# Patient Record
Sex: Male | Born: 1985 | Race: Black or African American | Hispanic: No | Marital: Single | Smoking: Current some day smoker
Health system: Southern US, Community
[De-identification: ages and names within clinical notes are randomized; demographics above are authoritative.]

## PROBLEM LIST (undated history)

## (undated) DIAGNOSIS — I10 Essential (primary) hypertension: Secondary | ICD-10-CM

## (undated) HISTORY — PX: TONSILLECTOMY: SUR1361

---

## 2007-09-20 ENCOUNTER — Emergency Department: Payer: Self-pay | Admitting: Emergency Medicine

## 2007-09-20 ENCOUNTER — Other Ambulatory Visit: Payer: Self-pay

## 2010-08-31 ENCOUNTER — Emergency Department (HOSPITAL_COMMUNITY)
Admission: EM | Admit: 2010-08-31 | Discharge: 2010-08-31 | Disposition: A | Discharge status: Home / Self Care | Payer: Self-pay | Attending: Emergency Medicine | Admitting: Emergency Medicine

## 2010-08-31 DIAGNOSIS — L02419 Cutaneous abscess of limb, unspecified: Secondary | ICD-10-CM | POA: Insufficient documentation

## 2010-08-31 DIAGNOSIS — A4902 Methicillin resistant Staphylococcus aureus infection, unspecified site: Secondary | ICD-10-CM | POA: Insufficient documentation

## 2011-11-22 ENCOUNTER — Encounter (HOSPITAL_COMMUNITY): Payer: Self-pay

## 2011-11-22 ENCOUNTER — Emergency Department (HOSPITAL_COMMUNITY)
Admission: EM | Admit: 2011-11-22 | Discharge: 2011-11-22 | Disposition: A | Discharge status: Home / Self Care | Payer: Worker's Compensation | Attending: Emergency Medicine | Admitting: Emergency Medicine

## 2011-11-22 ENCOUNTER — Emergency Department (HOSPITAL_COMMUNITY): Payer: Worker's Compensation

## 2011-11-22 DIAGNOSIS — S9030XA Contusion of unspecified foot, initial encounter: Secondary | ICD-10-CM

## 2011-11-22 DIAGNOSIS — I10 Essential (primary) hypertension: Secondary | ICD-10-CM | POA: Insufficient documentation

## 2011-11-22 DIAGNOSIS — S92919A Unspecified fracture of unspecified toe(s), initial encounter for closed fracture: Secondary | ICD-10-CM

## 2011-11-22 DIAGNOSIS — F17200 Nicotine dependence, unspecified, uncomplicated: Secondary | ICD-10-CM | POA: Insufficient documentation

## 2011-11-22 DIAGNOSIS — Y990 Civilian activity done for income or pay: Secondary | ICD-10-CM | POA: Insufficient documentation

## 2011-11-22 DIAGNOSIS — IMO0002 Reserved for concepts with insufficient information to code with codable children: Secondary | ICD-10-CM | POA: Insufficient documentation

## 2011-11-22 DIAGNOSIS — Y9289 Other specified places as the place of occurrence of the external cause: Secondary | ICD-10-CM | POA: Insufficient documentation

## 2011-11-22 HISTORY — DX: Essential (primary) hypertension: I10

## 2011-11-22 MED ORDER — HYDROCODONE-ACETAMINOPHEN 5-325 MG PO TABS
2.0000 | ORAL_TABLET | Freq: Once | ORAL | Status: AC
  Administered 2011-11-22: 2 via ORAL
  Filled 2011-11-22: qty 2

## 2011-11-22 MED ORDER — HYDROCODONE-ACETAMINOPHEN 5-500 MG PO TABS: 1.0000 | ORAL_TABLET | Freq: Four times a day (QID) | ORAL | Status: AC | PRN
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
## 2011-12-07 ENCOUNTER — Emergency Department (HOSPITAL_COMMUNITY): Payer: Self-pay

## 2011-12-07 ENCOUNTER — Encounter (HOSPITAL_COMMUNITY): Payer: Self-pay | Admitting: Emergency Medicine

## 2011-12-07 ENCOUNTER — Emergency Department (HOSPITAL_COMMUNITY)
Admission: EM | Admit: 2011-12-07 | Discharge: 2011-12-07 | Disposition: A | Discharge status: Home / Self Care | Payer: Self-pay | Attending: Emergency Medicine | Admitting: Emergency Medicine

## 2011-12-07 DIAGNOSIS — F17200 Nicotine dependence, unspecified, uncomplicated: Secondary | ICD-10-CM | POA: Insufficient documentation

## 2011-12-07 DIAGNOSIS — I10 Essential (primary) hypertension: Secondary | ICD-10-CM | POA: Insufficient documentation

## 2011-12-07 DIAGNOSIS — R079 Chest pain, unspecified: Secondary | ICD-10-CM | POA: Insufficient documentation

## 2011-12-07 LAB — BASIC METABOLIC PANEL
BUN: 12 mg/dL (ref 6–23)
Creatinine, Ser: 1.19 mg/dL (ref 0.50–1.35)
GFR calc Af Amer: >90 mL/min (ref >90)
GFR calc non Af Amer: 84 mL/min — ABNORMAL LOW (ref >90)
Glucose, Bld: 84 mg/dL (ref 70–99)
Potassium: 3.7 mEq/L (ref 3.5–5.1)

## 2011-12-07 LAB — CBC
HCT: 43.1 % (ref 39.0–52.0)
Hemoglobin: 15.1 g/dL (ref 13.0–17.0)
MCHC: 35 g/dL (ref 30.0–36.0)
MCV: 84 fL (ref 78.0–100.0)
RDW: 13.7 % (ref 11.5–15.5)

## 2011-12-07 NOTE — ED Notes (Signed)
Patient states "pain with deep inspiration only".  Has a non-productive cough for several days.

## 2011-12-07 NOTE — Progress Notes (Signed)
Pt is a 26 yo man who has had intermittent chest pain for 2 days.  Sometimes the pain is felt on the right and sometimes on the left.  He thought initially that it came from eating a "hot pocket."  He has taken no medicine for this.  His main risk factor is smoking cigarettes.  Exam shows heart and lungs normal, no calf tenderness, no Homans's sign.  EKG shows inverted T waves in inferior leads, otherwise his tests showed only bronchitic changes on the chest x-ray.  Advised it is safe to go home, Dx nonspecific chest pain, advised to stop smoking.

## 2011-12-07 NOTE — ED Provider Notes (Signed)
4:50 PM  Date: 12/07/2011  Rate: 70  Rhythm: normal sinus rhythm  QRS Axis: right  Intervals: normal  ST/T Wave abnormalities: Inverted T waves in inferior leads.  Conduction Disutrbances:none  Narrative Interpretation: Abnormal EKG  Old EKG Reviewed: none available    Carleene Cooper III, MD 12/07/11 [phone]

## 2011-12-07 NOTE — ED Notes (Signed)
MD at bedside. 

## 2011-12-07 NOTE — ED Notes (Signed)
Patient states "need his BP medication refilled".  Unknown name or dosage.

## 2011-12-07 NOTE — ED Notes (Signed)
Pt c/o generalized CP x 3 days that is worse with inspiration; pt sts non productive cough

## 2011-12-07 NOTE — ED Provider Notes (Signed)
History     CSN: [id number]  Arrival date & time 12/07/11  1245   First MD Initiated Contact with Patient 12/07/11 1605      Chief Complaint  Patient presents with  . Chest Pain    (Consider location/radiation/quality/duration/timing/severity/associated sxs/prior treatment) HPI Comments: Patient presenting with chest pain for the past three days.  Pain located across his anterior chest on both the left and the right side.  Pain does not radiate.  Pain has been intermittent over the past 3 days.  He states that the pain is worse when he is standing and when he is lying down.    Pain not associated with SOB, nausea, vomiting, numbness or diaphoresis.  He has had a nonproductive cough for several days.  He currently smokes one pack every 3 days and has smoked for seven years.  He denies prior history of DVT or PE.  Denies LE pain or swelling.  Denies prolonged travel or surgeries in the past 4 weeks.  Denies hemoptysis.  Denies prior history of malignancy.  Denies family history of premature cardiac disease.  He does have a history of HTN and has been off of his antihypertensive medication for several years.    Patient is a 26 y.o. male presenting with chest pain. The history is provided by the patient.  Chest Pain Primary symptoms include cough. Pertinent negatives for primary symptoms include no fever, no shortness of breath, no wheezing, no palpitations, no nausea, no vomiting and no dizziness.  Pertinent negatives for associated symptoms include no diaphoresis and no numbness.     Past Medical History  Diagnosis Date  . Hypertension     Past Surgical History  Procedure Date  . Tonsillectomy     History reviewed. No pertinent family history.  History  Substance Use Topics  . Smoking status: Current Everyday Smoker  . Smokeless tobacco: Not on file  . Alcohol Use: Yes     Occassional      Review of Systems  Constitutional: Negative for fever, chills and diaphoresis.    Respiratory: Positive for cough. Negative for shortness of breath and wheezing.   Cardiovascular: Positive for chest pain. Negative for palpitations and leg swelling.  Gastrointestinal: Negative for nausea and vomiting.  Musculoskeletal: Negative for back pain.  Neurological: Negative for dizziness, syncope, light-headedness and numbness.    Allergies  Review of patient's allergies indicates no known allergies.  Home Medications  No current outpatient prescriptions on file.  BP 144/101  Pulse 69  Temp 97.7 F (36.5 C) (Oral)  Resp 16  SpO2 100%  Physical Exam  Nursing note and vitals reviewed. Constitutional: He appears well-developed and well-nourished. No distress.  HENT:  Head: Normocephalic and atraumatic.  Mouth/Throat: Oropharynx is clear and moist.  Neck: Normal range of motion. Neck supple.  Cardiovascular: Normal rate, regular rhythm, normal heart sounds and intact distal pulses.   Pulmonary/Chest: Effort normal and breath sounds normal. No respiratory distress. He has no wheezes. He has no rales. He exhibits tenderness.  Abdominal: Soft. There is no tenderness.  Musculoskeletal: Normal range of motion. He exhibits no edema.  Neurological: He is alert.  Skin: Skin is warm and dry. No rash noted. He is not diaphoretic. No erythema.  Psychiatric: He has a normal mood and affect.    ED Course  Procedures (including critical care time)  Labs Reviewed  BASIC METABOLIC PANEL - Abnormal; Notable for the following:    GFR calc non Af Amer 84 (*)  All other components within normal limits  CBC  POCT I-STAT TROPONIN I   Dg Chest 2 View  12/07/2011  *RADIOLOGY REPORT*  Clinical Data: Worsening chest pain, shortness of breath, question hypertension  CHEST - 2 VIEW  Comparison: None  Findings: Upper normal heart size. Mediastinal contours and pulmonary vascularity normal. Minimal peribronchial thickening. No infiltrate, pleural effusion or pneumothorax. No acute  osseous findings.  IMPRESSION: Minimal bronchitic changes   Original Report Authenticated By: Lollie Marrow, M.D.      No diagnosis found.  5:03 PM Discussed with Dr. Ignacia Palma.  He will see and evaluate patient also.  MDM  Patient is to be discharged with recommendation to follow up with PCP in regards to today's hospital visit. Chest pain is not likely of cardiac or pulmonary etiology d/t presentation, PERC negative, VSS, no tracheal deviation, no new heart murmur, RRR, breath sounds equal bilaterally, EKG without acute abnormalities, negative troponin, and negative CXR.  Return precautions have been discussed with patient.  He appears reliable for follow up and is agreeable to discharge.   Case has been discussed with and seen by Dr. Ignacia Palma who agrees with the above plan to discharge.         Pascal Lux [place], PA-C 12/08/11 [phone]

## 2011-12-08 NOTE — ED Provider Notes (Signed)
Medical screening examination/treatment/procedure(s) were conducted as a shared visit with non-physician practitioner(s) and myself.  I personally evaluated the patient during the encounter Pt is a 26 yo man who has had intermittent chest pain for 2 days. Sometimes the pain is felt on the right and sometimes on the left. He thought initially that it came from eating a "hot pocket." He has taken no medicine for this. His main risk factor is smoking cigarettes. Exam shows heart and lungs normal, no calf tenderness, no Homans's sign. EKG shows inverted T waves in inferior leads, otherwise his tests showed only bronchitic changes on the chest x-ray. Advised it is safe to go home, Dx nonspecific chest pain, advised to stop smoking.        Carleene Cooper III, MD 12/08/11 [phone]

## 2013-08-04 ENCOUNTER — Emergency Department: Payer: Self-pay | Admitting: Emergency Medicine

## 2013-08-13 ENCOUNTER — Ambulatory Visit: Payer: Self-pay | Admitting: Family Medicine
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
## 2015-06-17 ENCOUNTER — Emergency Department
Admission: EM | Admit: 2015-06-17 | Discharge: 2015-06-17 | Disposition: A | Discharge status: Home / Self Care | Payer: Self-pay | Attending: Emergency Medicine | Admitting: Emergency Medicine

## 2015-06-17 ENCOUNTER — Emergency Department: Payer: Self-pay

## 2015-06-17 DIAGNOSIS — I10 Essential (primary) hypertension: Secondary | ICD-10-CM | POA: Insufficient documentation

## 2015-06-17 DIAGNOSIS — F17200 Nicotine dependence, unspecified, uncomplicated: Secondary | ICD-10-CM | POA: Insufficient documentation

## 2015-06-17 DIAGNOSIS — F419 Anxiety disorder, unspecified: Secondary | ICD-10-CM | POA: Insufficient documentation

## 2015-06-17 DIAGNOSIS — R0789 Other chest pain: Secondary | ICD-10-CM | POA: Insufficient documentation

## 2015-06-17 LAB — CBC
HEMATOCRIT: 48.7 % (ref 40.0–52.0)
Hemoglobin: 16.4 g/dL (ref 13.0–18.0)
MCH: 28.9 pg (ref 26.0–34.0)
MCHC: 33.7 g/dL (ref 32.0–36.0)
MCV: 85.7 fL (ref 80.0–100.0)
Platelets: 288 10*3/uL (ref 150–440)
RBC: 5.68 MIL/uL (ref 4.40–5.90)
RDW: 14.3 % (ref 11.5–14.5)
WBC: 6.8 10*3/uL (ref 3.8–10.6)

## 2015-06-17 LAB — BASIC METABOLIC PANEL
Anion gap: 9 (ref 5–15)
BUN: 12 mg/dL (ref 6–20)
CALCIUM: 10 mg/dL (ref 8.9–10.3)
CO2: 30 mmol/L (ref 22–32)
CREATININE: 1.28 mg/dL — AB (ref 0.61–1.24)
Chloride: 106 mmol/L (ref 101–111)
GFR calc Af Amer: >60 mL/min (ref >60)
Glucose, Bld: 103 mg/dL — ABNORMAL HIGH (ref 65–99)
POTASSIUM: 4 mmol/L (ref 3.5–5.1)
SODIUM: 145 mmol/L (ref 135–145)

## 2015-06-17 LAB — TROPONIN I
Troponin I: <0.03 ng/mL (ref <0.031)
Troponin I: <0.03 ng/mL (ref <0.031)

## 2015-06-17 MED ORDER — KETOROLAC TROMETHAMINE 60 MG/2ML IM SOLN: 30.0000 mg | Freq: Once | INTRAMUSCULAR | Status: DC

## 2015-06-17 MED ORDER — AMLODIPINE BESYLATE 5 MG PO TABS: 5.0000 mg | ORAL_TABLET | Freq: Every day | ORAL | Status: AC

## 2015-06-17 MED ORDER — KETOROLAC TROMETHAMINE 30 MG/ML IJ SOLN
INTRAMUSCULAR | Status: AC
  Administered 2015-06-17: 30 mg
  Filled 2015-06-17: qty 1

## 2015-06-17 NOTE — ED Notes (Signed)
Pt c/o intermittant chest pain x 1 week,  He also reports that his arm and legs have tingling sensation/heavy feeling at times. He has a high stress job that is physical at times and he states that he feels increased stress and anxiety. Pt reports hx of HTN but states that he has not been taking his medication because he has not had a PMD for "a while."  Pt denies SOB, diaphoresis, nausea or vomiting.  Skin is warm and dry, pt converses with ease.

## 2015-06-17 NOTE — ED Provider Notes (Addendum)
[facility] Emergency Department Provider Note  ____________________________________________   I have reviewed the triage vital signs and the nursing notes.   HISTORY  Chief Complaint I have a stressful job   HPI Thomas Hickman is a 30 y.o. male  hypertension and noncompliance, as well as anxiety states that he works in a stressful job. 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 He carries heavy boxes in. 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 In any event, a week ago he carried a very heavy set of items in the next day he had a soreness in his chest wall. That persisted for the week off and on. It was nonexertional. It was positional. Sometimes awakened myopathy rolled over the wrong way. There is no migration or radiation. The pain seems to be across the center of his pectoralis muscle bilaterally. It is worse when he picked heavy objects up. It is not worse with food. It is not worse with exertion. He has no shortness of breath diaphoresis nausea or vomiting. He states that he is worried about this discomfort. It was almost better and then yesterday he unload the truck again and feels that he get aggravated those muscles. He denies any leg swelling, pleuritic chest pain, recent surgery, or personal or family history of PE or DVT. No recent travel. The only thing that he can think that makes the pain worsens picking up heavy objects using his chest. No family history of CAD he states.  Past Medical History  Diagnosis Date  . Hypertension     There are no active problems to display for this patient.   Past Surgical History  Procedure Laterality Date  . Tonsillectomy      No current outpatient prescriptions on file.  Allergies Review of patient's allergies indicates no known allergies.  No family history on file.  Social History Social History  Substance Use Topics  . Smoking status: Current Every Day Smoker  . Smokeless tobacco: None  .  Alcohol Use: Yes     Comment: Occassional    Review of Systems Constitutional: No fever/chills Eyes: No visual changes. ENT: No sore throat. No stiff neck no neck pain Cardiovascular: He history of present illness chest pain. Respiratory: Denies shortness of breath. Gastrointestinal:   no vomiting.  No diarrhea.  No constipation. Genitourinary: Negative for dysuria. Musculoskeletal: Negative lower extremity swelling Skin: Negative for rash. Neurological: Negative for headaches, focal weakness or numbness. 10-point ROS otherwise negative.  ____________________________________________   PHYSICAL EXAM:  VITAL SIGNS: ED Triage Vitals  Enc Vitals Group     BP 06/17/15 1053 165/98 mmHg     Pulse Rate 06/17/15 1053 79     Resp 06/17/15 1053 16     Temp 06/17/15 1053 98.1 F (36.7 C)     Temp Source 06/17/15 1053 Oral     SpO2 06/17/15 1053 100 %     Weight 06/17/15 1048 160 lb (72.576 kg)     Height 06/17/15 1048  (1.727 m)     Head Cir --      Peak Flow --      Pain Score 06/17/15 1048 8     Pain Loc --      Pain Edu? --      Excl. in GC? --     Constitutional: Alert and oriented. Well appearing and in no acute distress. Watching television Eyes: Conjunctivae are normal. PERRL. EOMI. Head: Atraumatic. Nose: No  congestion/rhinnorhea. Mouth/Throat: Mucous membranes are moist.  Oropharynx non-erythematous. Neck: No stridor.   Nontender with no meningismus Cardiovascular: Normal rate, regular rhythm. Grossly normal heart sounds.  Good peripheral circulation. Chest: Tender to palpation of the anterior pectoralis muscles bilaterally. By palpating these areas patient states "ouch that's the pain right there" and pulls back. There is no flail chest there is no crepitus there is no rib fracture palpated. I can also elicit the pain by having the patient press against resistance with his arms. Respiratory: Normal respiratory effort.  No retractions. Lungs CTAB. Abdominal: Soft  and nontender. No distention. No guarding no rebound Back:  There is no focal tenderness or step off there is no midline tenderness there are no lesions noted. there is no CVA tenderness Musculoskeletal: No lower extremity tenderness. No joint effusions, no DVT signs strong distal pulses no edema Neurologic:  Normal speech and language. No gross focal neurologic deficits are appreciated.  Skin:  Skin is warm, dry and intact. No rash noted. Psychiatric: Mood and affect are anxious. Speech and behavior are normal.  ____________________________________________   LABS (all labs ordered are listed, but only abnormal results are displayed)  Labs Reviewed  BASIC METABOLIC PANEL - Abnormal; Notable for the following:    Glucose, Bld 103 (*)    Creatinine, Ser 1.28 (*)    All other components within normal limits  CBC  TROPONIN I  TROPONIN I   ____________________________________________  EKG  I personally interpreted any EKGs ordered by me or triage Normal sinus rhythm rate 80 bpm no acute ST elevation or acute ST depression, there are flipped T waves in 3 and aVF that were seen in 2013. ____________________________________________  RADIOLOGY  I reviewed any imaging ordered by me or triage that were performed during my shift and, if possible, patient and/or family made aware of any abnormal findings. ____________________________________________   PROCEDURES  Procedure(s) performed: None  Critical Care performed: None  ____________________________________________   INITIAL IMPRESSION / ASSESSMENT AND PLAN / ED COURSE  Pertinent labs & imaging results that were available during my care of the patient were reviewed by me and considered in my medical decision making (see chart for details).  Patient with reproducible chest wall pain after lifting heavy objects. Although he does have a history of hypertension and anxiety, I do not believe this likely represents ACS. He is perk  negative I do not believe he has a PE. He has no risk factors for early CAD, dissection myocarditis endocarditis or pericarditis. We will send a second set of cardiac markers as a precaution, give him anti-inflammatory pain medication and reassess.At this time, there does not appear to be clinical evidence to support the diagnosis of pulmonary embolus, dissection, myocarditis, endocarditis, pericarditis, pericardial tamponade, acute coronary syndrome, pneumothorax, pneumonia, or any other acute intrathoracic pathology that will require admission or acute intervention. Nor is there evidence of any significant intra-abdominal pathology causing this discomfort. His blood pressure is slightly up he is very anxious by his own admission and description. He is supposed to be taking blood pressure medication we will offer that to him  ----------------------------------------- 3:48 PM on 06/17/2015 -----------------------------------------  Pain free after toradol eager to go home. Return precatuiosn and follow up given and understood.____________________________________________   FINAL CLINICAL IMPRESSION(S) / ED DIAGNOSES  Final diagnoses:  None      This chart was dictated using voice recognition software.  Despite best efforts to proofread,  errors can occur which can change meaning.  Jeanmarie PlantJames A Naiyah Klostermann, MD 06/17/15 1453  Jeanmarie PlantJames A Rosaire Cueto, MD 06/17/15 [phone]

## 2015-06-17 NOTE — Discharge Instructions (Signed)
Chest Wall Pain °Chest wall pain is pain in or around the bones and muscles of your chest. Sometimes, an injury causes this pain. Sometimes, the cause may not be known. This pain may take several weeks or longer to get better. °HOME CARE INSTRUCTIONS  °Pay attention to any changes in your symptoms. Take these actions to help with your pain:  °· Rest as told by your health care provider.   °· Avoid activities that cause pain. These include any activities that use your chest muscles or your abdominal and side muscles to lift heavy items.    °· If directed, apply ice to the painful area: °¨ Put ice in a plastic bag. °¨ Place a towel between your skin and the bag. °¨ Leave the ice on for 20 minutes, 2-3 times per day. °· Take over-the-counter and prescription medicines only as told by your health care provider. °· Do not use tobacco products, including cigarettes, chewing tobacco, and e-cigarettes. If you need help quitting, ask your health care provider. °· Keep all follow-up visits as told by your health care provider. This is important. °SEEK MEDICAL CARE IF: °· You have a fever. °· Your chest pain becomes worse. °· You have new symptoms. °SEEK IMMEDIATE MEDICAL CARE IF: °· You have nausea or vomiting. °· You feel sweaty or light-headed. °· You have a cough with phlegm (sputum) or you cough up blood. °· You develop shortness of breath. °  °This information is not intended to replace advice given to you by your health care provider. Make sure you discuss any questions you have with your health care provider. °  °Document Released: 03/29/2005 Document Revised: 12/18/2014 Document Reviewed: 06/24/2014 °Elsevier Interactive Patient Education ©2016 [employer] ° °Chest Pain Observation °It is often hard to give a specific diagnosis for the cause of chest pain. Among other possibilities your symptoms might be caused by inadequate oxygen delivery to your heart (angina). Angina that is not treated or evaluated can lead to  a heart attack (myocardial infarction) or death. °Blood tests, electrocardiograms, and X-rays may have been done to help determine a possible cause of your chest pain. After evaluation and observation, your health care provider has determined that it is unlikely your pain was caused by an unstable condition that requires hospitalization. However, a full evaluation of your pain may need to be completed, with additional diagnostic testing as directed. It is very important to keep your follow-up appointments. Not keeping your follow-up appointments could result in permanent heart damage, disability, or death. If there is any problem keeping your follow-up appointments, you must call your health care provider. °HOME CARE INSTRUCTIONS  °Due to the slight chance that your pain could be angina, it is important to follow your health care provider's treatment plan and also maintain a healthy lifestyle: °· Maintain or work toward achieving a healthy weight. °· Stay physically active and exercise regularly. °· Decrease your salt intake. °· Eat a balanced, healthy diet. Talk to a dietitian to learn about heart-healthy foods. °· Increase your fiber intake by including whole grains, vegetables, fruits, and nuts in your diet. °· Avoid situations that cause stress, anger, or depression. °· Take medicines as advised by your health care provider. Report any side effects to your health care provider. Do not stop medicines or adjust the dosages on your own. °· Quit smoking. Do not use nicotine patches or gum until you check with your health care provider. °· Keep your blood pressure, blood sugar, and cholesterol levels within normal   limits. °· Limit alcohol intake to no more than 1 drink per day for women who are not pregnant and 2 drinks per day for men. °· Do not abuse drugs. °SEEK IMMEDIATE MEDICAL CARE IF: °You have severe chest pain or pressure which may include symptoms such as: °· You feel pain or pressure in your arms, neck,  jaw, or back. °· You have severe back or abdominal pain, feel sick to your stomach (nauseous), or throw up (vomit). °· You are sweating profusely. °· You are having a fast or irregular heartbeat. °· You feel short of breath while at rest. °· You notice increasing shortness of breath during rest, sleep, or with activity. °· You have chest pain that does not get better after rest or after taking your usual medicine. °· You wake from sleep with chest pain. °· You are unable to sleep because you cannot breathe. °· You develop a frequent cough or you are coughing up blood. °· You feel dizzy, faint, or experience extreme fatigue. °· You develop severe weakness, dizziness, fainting, or chills. °Any of these symptoms may represent a serious problem that is an emergency. Do not wait to see if the symptoms will go away. Call your local emergency services (911 in the [place] Do not drive yourself to the hospital. °MAKE SURE YOU: °· Understand these instructions. °· Will watch your condition. °· Will get help right away if you are not doing well or get worse. °  °This information is not intended to replace advice given to you by your health care provider. Make sure you discuss any questions you have with your health care provider. °  °Document Released: 05/01/2010 Document Revised: 04/03/2013 Document Reviewed: 09/28/2012 °Elsevier Interactive Patient Education ©2016 [employer] ° °

## 2015-06-17 NOTE — ED Notes (Signed)
Pt c/o chest pain that radiates left to right vis verse , intermittent for the past couple of days with SOB.. Denies any recent cough/congestion.. Pt is in NAD, respirations WNL, skin is warm and dry.

## 2015-07-19 ENCOUNTER — Emergency Department
Admission: EM | Admit: 2015-07-19 | Discharge: 2015-07-19 | Disposition: A | Discharge status: Home / Self Care | Payer: Self-pay | Attending: Emergency Medicine | Admitting: Emergency Medicine

## 2015-07-19 ENCOUNTER — Encounter: Payer: Self-pay | Admitting: Emergency Medicine

## 2015-07-19 ENCOUNTER — Emergency Department: Payer: Self-pay

## 2015-07-19 DIAGNOSIS — I10 Essential (primary) hypertension: Secondary | ICD-10-CM | POA: Insufficient documentation

## 2015-07-19 DIAGNOSIS — R079 Chest pain, unspecified: Secondary | ICD-10-CM | POA: Insufficient documentation

## 2015-07-19 DIAGNOSIS — F410 Panic disorder [episodic paroxysmal anxiety] without agoraphobia: Secondary | ICD-10-CM | POA: Insufficient documentation

## 2015-07-19 DIAGNOSIS — F17200 Nicotine dependence, unspecified, uncomplicated: Secondary | ICD-10-CM | POA: Insufficient documentation

## 2015-07-19 LAB — CBC
HCT: 48.6 % (ref 40.0–52.0)
Hemoglobin: 16.4 g/dL (ref 13.0–18.0)
MCH: 28.8 pg (ref 26.0–34.0)
MCHC: 33.7 g/dL (ref 32.0–36.0)
MCV: 85.5 fL (ref 80.0–100.0)
PLATELETS: 310 10*3/uL (ref 150–440)
RBC: 5.68 MIL/uL (ref 4.40–5.90)
RDW: 14.4 % (ref 11.5–14.5)
WBC: 7.7 10*3/uL (ref 3.8–10.6)

## 2015-07-19 LAB — BASIC METABOLIC PANEL
Anion gap: 7 (ref 5–15)
BUN: 15 mg/dL (ref 6–20)
CALCIUM: 9.7 mg/dL (ref 8.9–10.3)
CO2: 27 mmol/L (ref 22–32)
CREATININE: 1.26 mg/dL — AB (ref 0.61–1.24)
Chloride: 104 mmol/L (ref 101–111)
GFR calc Af Amer: >60 mL/min (ref >60)
GLUCOSE: 94 mg/dL (ref 65–99)
POTASSIUM: 3.2 mmol/L — AB (ref 3.5–5.1)
SODIUM: 138 mmol/L (ref 135–145)

## 2015-07-19 LAB — TROPONIN I

## 2015-07-19 MED ORDER — IBUPROFEN 800 MG PO TABS
800.0000 mg | ORAL_TABLET | Freq: Once | ORAL | Status: AC
  Administered 2015-07-19: 800 mg via ORAL
  Filled 2015-07-19: qty 1

## 2015-07-19 MED ORDER — DIAZEPAM 5 MG PO TABS
10.0000 mg | ORAL_TABLET | Freq: Once | ORAL | Status: AC
  Administered 2015-07-19: 10 mg via ORAL
  Filled 2015-07-19: qty 2

## 2015-07-19 MED ORDER — IBUPROFEN 800 MG PO TABS: 800.0000 mg | ORAL_TABLET | Freq: Three times a day (TID) | ORAL | Status: AC | PRN

## 2015-07-19 MED ORDER — DIAZEPAM 5 MG PO TABS: 5.0000 mg | ORAL_TABLET | Freq: Three times a day (TID) | ORAL | Status: AC | PRN

## 2015-07-19 NOTE — ED Provider Notes (Signed)
[facility] Emergency Department Provider Note     Time seen: ----------------------------------------- 8:23 PM on 07/19/2015 -----------------------------------------    I have reviewed the triage vital signs and the nursing notes.   HISTORY  Chief Complaint Chest Pain    HPI Thomas Hickman is a 30 y.o. male who presents to ER forfor chest pain that is on his right side currently. Patient states sometimes some both sides, sometimes goes into his abdomen. Patient started having this about a month ago and has been coming and going. Patient is not sure if he is anxious, states he vomited once today and he has felt dizzy and short of breath at times. Currently the pain is moderate, nothing makes it better.   Past Medical History  Diagnosis Date  . Hypertension     There are no active problems to display for this patient.   Past Surgical History  Procedure Laterality Date  . Tonsillectomy      Allergies Review of patient's allergies indicates no known allergies.  Social History Social History  Substance Use Topics  . Smoking status: Current Some Day Smoker  . Smokeless tobacco: None  . Alcohol Use: No    Review of Systems Constitutional: Negative for fever. Eyes: Negative for visual changes. ENT: Negative for sore throat. Cardiovascular: Positive for chest pain Respiratory: Positive shortness of breath Gastrointestinal: Negative for abdominal pain, vomiting and diarrhea. Genitourinary: Negative for dysuria. Musculoskeletal: Negative for back pain. Skin: Negative for rash. Neurological: Negative for headaches, focal weakness or numbness.  10-point ROS otherwise negative.  ____________________________________________   PHYSICAL EXAM:  VITAL SIGNS: ED Triage Vitals  Enc Vitals Group     BP 07/19/15 1853 150/117 mmHg     Pulse Rate 07/19/15 1853 102     Resp 07/19/15 1853 18     Temp 07/19/15 1853 98.3 F (36.8 C)     Temp  Source 07/19/15 1853 Oral     SpO2 07/19/15 1853 99 %     Weight 07/19/15 1853 150 lb (68.04 kg)     Height 07/19/15 1853  (1.727 m)     Head Cir --      Peak Flow --      Pain Score 07/19/15 1854 9     Pain Loc --      Pain Edu? --      Excl. in GC? --    Constitutional: Alert and oriented. Anxious, no acute distress Eyes: Conjunctivae are normal. PERRL. Normal extraocular movements. ENT   Head: Normocephalic and atraumatic.   Nose: No congestion/rhinnorhea.   Mouth/Throat: Mucous membranes are moist.   Neck: No stridor. Cardiovascular: Normal rate, regular rhythm. Normal and symmetric distal pulses are present in all extremities. No murmurs, rubs, or gallops. Respiratory: Normal respiratory effort without tachypnea nor retractions. Breath sounds are clear and equal bilaterally. No wheezes/rales/rhonchi. Gastrointestinal: Soft and nontender. No distention. No abdominal bruits.  Musculoskeletal: Nontender with normal range of motion in all extremities. No joint effusions.  No lower extremity tenderness nor edema. Right-sided chest wall tenderness Neurologic:  Normal speech and language. No gross focal neurologic deficits are appreciated. Speech is normal. No gait instability. Skin:  Skin is warm, dry and intact. No rash noted. Psychiatric: He pressed mood and affect ____________________________________________  EKG: Interpreted by me. Sinus rhythm with a rate of 93 bpm, normal PR interval, normal QRS, normal QT interval. Normal axis.  ____________________________________________  ED COURSE:  Pertinent labs & imaging results that were available during  my care of the patient were reviewed by me and considered in my medical decision making (see chart for details). Patient is in no acute distress, will check basic labs, give oral Valium and Motrin. ____________________________________________    LABS (pertinent positives/negatives)  Labs Reviewed  BASIC METABOLIC  PANEL - Abnormal; Notable for the following:    Potassium 3.2 (*)    Creatinine, Ser 1.26 (*)    All other components within normal limits  CBC  TROPONIN I    RADIOLOGY  Chest x-ray  IMPRESSION: No acute pulmonary process. ____________________________________________  FINAL ASSESSMENT AND PLAN  Chest pain, panic attack  Plan: Patient with labs and imaging as dictated above. Patient is in no acute distress, this appears to be anxiety related. He is feeling better after Motrin and Valium. He'll be discharged on same. Have advised close follow-up with etiology.   Emily FilbertWilliams, Everton Bertha E, MD   Emily FilbertJonathan E Francies Inch, MD 07/19/15 2033

## 2015-07-19 NOTE — ED Notes (Signed)
Non-pharmaceutical methods of anxiety management discussed with patient including mindfulness and deep breathing.  The option of counseling also discussed.

## 2015-07-19 NOTE — ED Notes (Signed)
Pt reports he has had high BP, was on amlodipine but it wasn't working.  Pt reports currently seeing black spots and knows his BP is up.  Current bp 160/102

## 2015-07-19 NOTE — ED Notes (Signed)
Pt calling family to pick him up from ED

## 2015-07-19 NOTE — ED Notes (Signed)
Pt complains of pain in chest that is on the left and right side. Pt states pain is constant and is a shooting pain. Pt states it started 3 days ago and feels heaviness in both arms. Pt states he vomited x1 today and has felt dizzy and SOB at times. Pt was seen here one month ago for same complaint does not remember what he was told and has not followed up since that time with a PCP.

## 2015-07-19 NOTE — Discharge Instructions (Signed)
Nonspecific Chest Pain  °Chest pain can be caused by many different conditions. There is always a chance that your pain could be related to something serious, such as a heart attack or a blood clot in your lungs. Chest pain can also be caused by conditions that are not life-threatening. If you have chest pain, it is very important to follow up with your health care provider. °CAUSES  °Chest pain can be caused by: °· Heartburn. °· Pneumonia or bronchitis. °· Anxiety or stress. °· Inflammation around your heart (pericarditis) or lung (pleuritis or pleurisy). °· A blood clot in your lung. °· A collapsed lung (pneumothorax). It can develop suddenly on its own (spontaneous pneumothorax) or from trauma to the chest. °· Shingles infection (varicella-zoster virus). °· Heart attack. °· Damage to the bones, muscles, and cartilage that make up your chest wall. This can include: °· Bruised bones due to injury. °· Strained muscles or cartilage due to frequent or repeated coughing or overwork. °· Fracture to one or more ribs. °· Sore cartilage due to inflammation (costochondritis). °RISK FACTORS  °Risk factors for chest pain may include: °· Activities that increase your risk for trauma or injury to your chest. °· Respiratory infections or conditions that cause frequent coughing. °· Medical conditions or overeating that can cause heartburn. °· Heart disease or family history of heart disease. °· Conditions or health behaviors that increase your risk of developing a blood clot. °· Having had chicken pox (varicella zoster). °SIGNS AND SYMPTOMS °Chest pain can feel like: °· Burning or tingling on the surface of your chest or deep in your chest. °· Crushing, pressure, aching, or squeezing pain. °· Dull or sharp pain that is worse when you move, cough, or take a deep breath. °· Pain that is also felt in your back, neck, shoulder, or arm, or pain that spreads to any of these areas. °Your chest pain may come and go, or it may stay  constant. °DIAGNOSIS °Lab tests or other studies may be needed to find the cause of your pain. Your health care provider may have you take a test called an ambulatory ECG (electrocardiogram). An ECG records your heartbeat patterns at the time the test is performed. You may also have other tests, such as: °· Transthoracic echocardiogram (TTE). During echocardiography, sound waves are used to create a picture of all of the heart structures and to look at how blood flows through your heart. °· Transesophageal echocardiogram (TEE). This is a more advanced imaging test that obtains images from inside your body. It allows your health care provider to see your heart in finer detail. °· Cardiac monitoring. This allows your health care provider to monitor your heart rate and rhythm in real time. °· Holter monitor. This is a portable device that records your heartbeat and can help to diagnose abnormal heartbeats. It allows your health care provider to track your heart activity for several days, if needed. °· Stress tests. These can be done through exercise or by taking medicine that makes your heart beat more quickly. °· Blood tests. °· Imaging tests. °TREATMENT  °Your treatment depends on what is causing your chest pain. Treatment may include: °· Medicines. These may include: °· Acid blockers for heartburn. °· Anti-inflammatory medicine. °· Pain medicine for inflammatory conditions. °· Antibiotic medicine, if an infection is present. °· Medicines to dissolve blood clots. °· Medicines to treat coronary artery disease. °· Supportive care for conditions that do not require medicines. This may include: °· Resting. °· Applying heat   or cold packs to injured areas. °· Limiting activities until pain decreases. °HOME CARE INSTRUCTIONS °· If you were prescribed an antibiotic medicine, finish it all even if you start to feel better. °· Avoid any activities that bring on chest pain. °· Do not use any tobacco products, including  cigarettes, chewing tobacco, or electronic cigarettes. If you need help quitting, ask your health care provider. °· Do not drink alcohol. °· Take medicines only as directed by your health care provider. °· Keep all follow-up visits as directed by your health care provider. This is important. This includes any further testing if your chest pain does not go away. °· If heartburn is the cause for your chest pain, you may be told to keep your head raised (elevated) while sleeping. This reduces the chance that acid will go from your stomach into your esophagus. °· Make lifestyle changes as directed by your health care provider. These may include: °· Getting regular exercise. Ask your health care provider to suggest some activities that are safe for you. °· Eating a heart-healthy diet. A registered dietitian can help you to learn healthy eating options. °· Maintaining a healthy weight. °· Managing diabetes, if necessary. °· Reducing stress. °SEEK MEDICAL CARE IF: °· Your chest pain does not go away after treatment. °· You have a rash with blisters on your chest. °· You have a fever. °SEEK IMMEDIATE MEDICAL CARE IF:  °· Your chest pain is worse. °· You have an increasing cough, or you cough up blood. °· You have severe abdominal pain. °· You have severe weakness. °· You faint. °· You have chills. °· You have sudden, unexplained chest discomfort. °· You have sudden, unexplained discomfort in your arms, back, neck, or jaw. °· You have shortness of breath at any time. °· You suddenly start to sweat, or your skin gets clammy. °· You feel nauseous or you vomit. °· You suddenly feel light-headed or dizzy. °· Your heart begins to beat quickly, or it feels like it is skipping beats. °These symptoms may represent a serious problem that is an emergency. Do not wait to see if the symptoms will go away. Get medical help right away. Call your local emergency services (911 in the [place] Do not drive yourself to the hospital. °  °This  information is not intended to replace advice given to you by your health care provider. Make sure you discuss any questions you have with your health care provider. °  °Document Released: 01/06/2005 Document Revised: 04/19/2014 Document Reviewed: 11/02/2013 °Elsevier Interactive Patient Education ©2016 [employer] ° °Panic Attacks °Panic attacks are sudden, short-lived surges of severe anxiety, fear, or discomfort. They may occur for no reason when you are relaxed, when you are anxious, or when you are sleeping. Panic attacks may occur for a number of reasons:  °· Healthy people occasionally have panic attacks in extreme, life-threatening situations, such as war or natural disasters. Normal anxiety is a protective mechanism of the body that helps us react to danger (fight or flight response). °· Panic attacks are often seen with anxiety disorders, such as panic disorder, social anxiety disorder, generalized anxiety disorder, and phobias. Anxiety disorders cause excessive or uncontrollable anxiety. They may interfere with your relationships or other life activities. °· Panic attacks are sometimes seen with other mental illnesses, such as depression and posttraumatic stress disorder. °· Certain medical conditions, prescription medicines, and drugs of abuse can cause panic attacks. °SYMPTOMS  °Panic attacks start suddenly, peak within 20 minutes, and are   accompanied by four or more of the following symptoms: °· Pounding heart or fast heart rate (palpitations). °· Sweating. °· Trembling or shaking. °· Shortness of breath or feeling smothered. °· Feeling choked. °· Chest pain or discomfort. °· Nausea or strange feeling in your stomach. °· Dizziness, light-headedness, or feeling like you will faint. °· Chills or hot flushes. °· Numbness or tingling in your lips or hands and feet. °· Feeling that things are not real or feeling that you are not yourself. °· Fear of losing control or going crazy. °· Fear of dying. °Some  of these symptoms can mimic serious medical conditions. For example, you may think you are having a heart attack. Although panic attacks can be very scary, they are not life threatening. °DIAGNOSIS  °Panic attacks are diagnosed through an assessment by your health care provider. Your health care provider will ask questions about your symptoms, such as where and when they occurred. Your health care provider will also ask about your medical history and use of alcohol and drugs, including prescription medicines. Your health care provider may order blood tests or other studies to rule out a serious medical condition. Your health care provider may refer you to a mental health professional for further evaluation. °TREATMENT  °· Most healthy people who have one or two panic attacks in an extreme, life-threatening situation will not require treatment. °· The treatment for panic attacks associated with anxiety disorders or other mental illness typically involves counseling with a mental health professional, medicine, or a combination of both. Your health care provider will help determine what treatment is best for you. °· Panic attacks due to physical illness usually go away with treatment of the illness. If prescription medicine is causing panic attacks, talk with your health care provider about stopping the medicine, decreasing the dose, or substituting another medicine. °· Panic attacks due to alcohol or drug abuse go away with abstinence. Some adults need professional help in order to stop drinking or using drugs. °HOME CARE INSTRUCTIONS  °· Take all medicines as directed by your health care provider.   °· Schedule and attend follow-up visits as directed by your health care provider. It is important to keep all your appointments. °SEEK MEDICAL CARE IF: °· You are not able to take your medicines as prescribed. °· Your symptoms do not improve or get worse. °SEEK IMMEDIATE MEDICAL CARE IF:  °· You experience panic attack  symptoms that are different than your usual symptoms. °· You have serious thoughts about hurting yourself or others. °· You are taking medicine for panic attacks and have a serious side effect. °MAKE SURE YOU: °· Understand these instructions. °· Will watch your condition. °· Will get help right away if you are not doing well or get worse. °  °This information is not intended to replace advice given to you by your health care provider. Make sure you discuss any questions you have with your health care provider. °  °Document Released: 03/29/2005 Document Revised: 04/03/2013 Document Reviewed: 11/10/2012 °Elsevier Interactive Patient Education ©2016 [employer] ° °

## 2016-06-27 IMAGING — CR DG CHEST 2V
1 series · 2 of 2 positions shown · non-contrast
Comparison: 06/17/2015

CLINICAL DATA: Chest pain and shortness of breath for 2 days.

EXAM:
CHEST  2 VIEW

[Series 1: dg chest 2 view · 0.14mm/px · 2 of 2 slices shown]
[im 1/2]
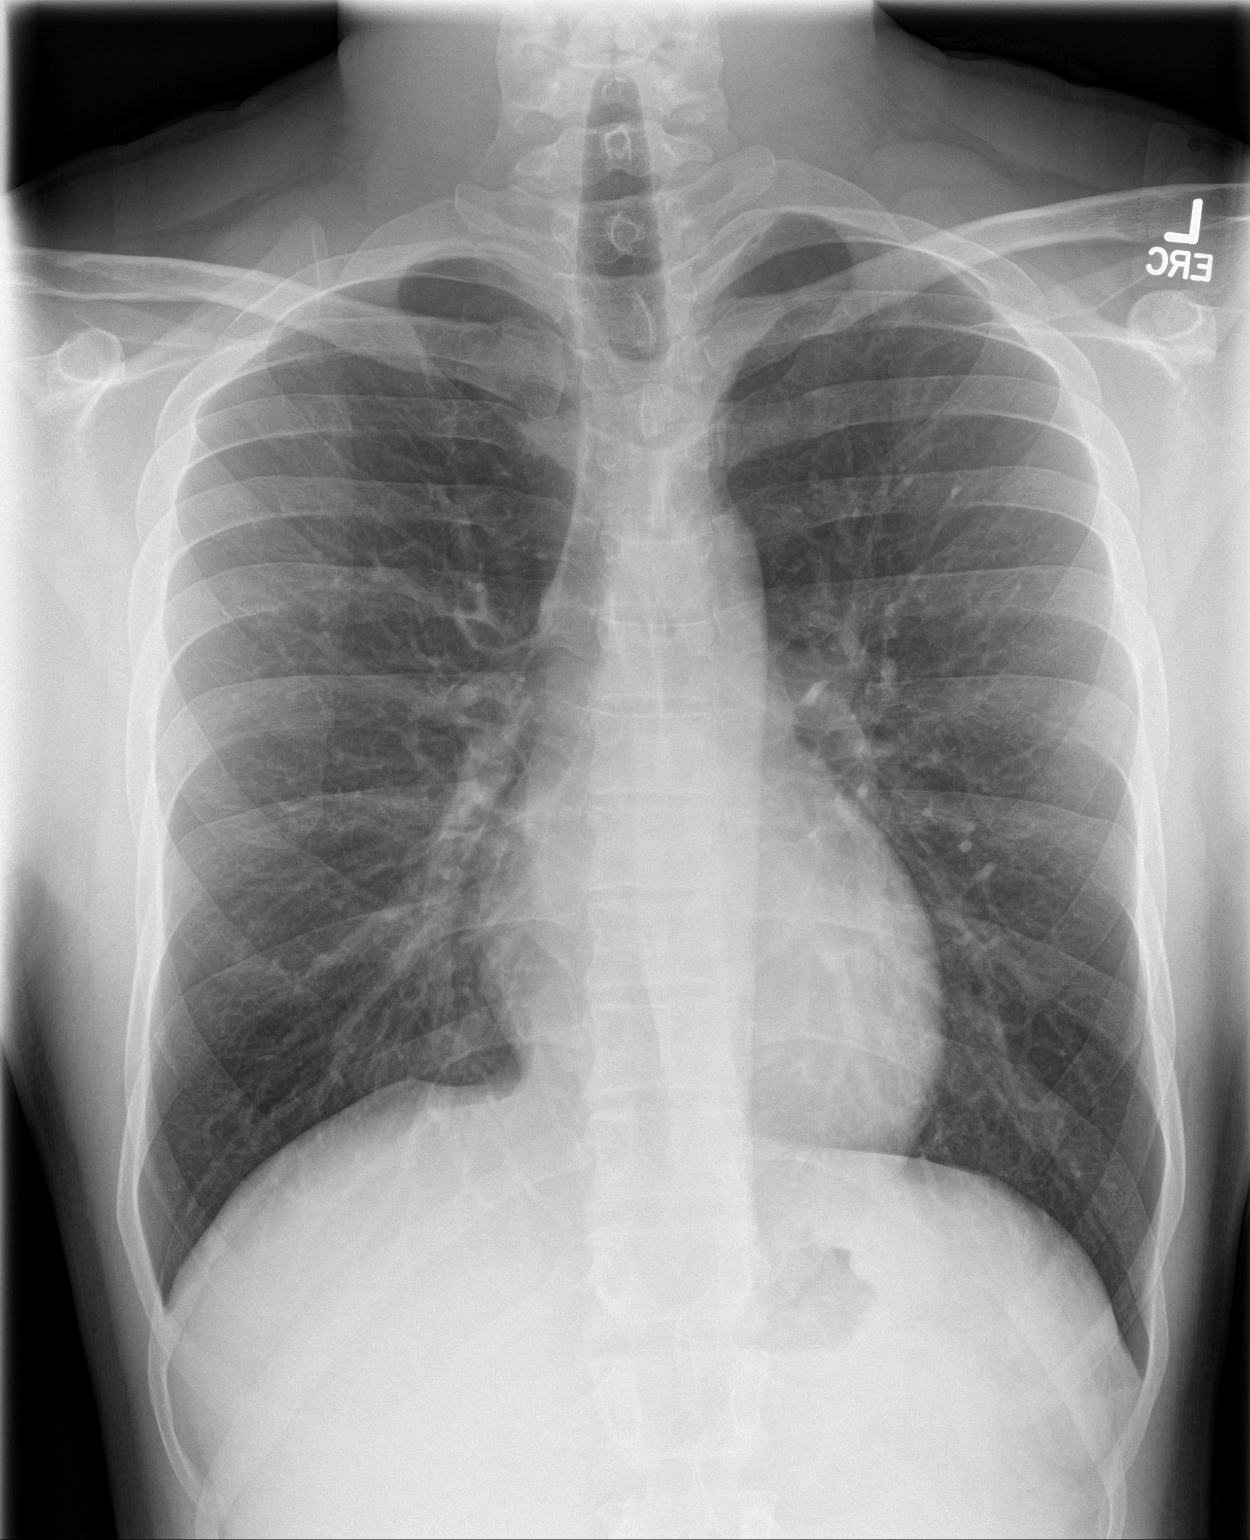
[im 2/2]
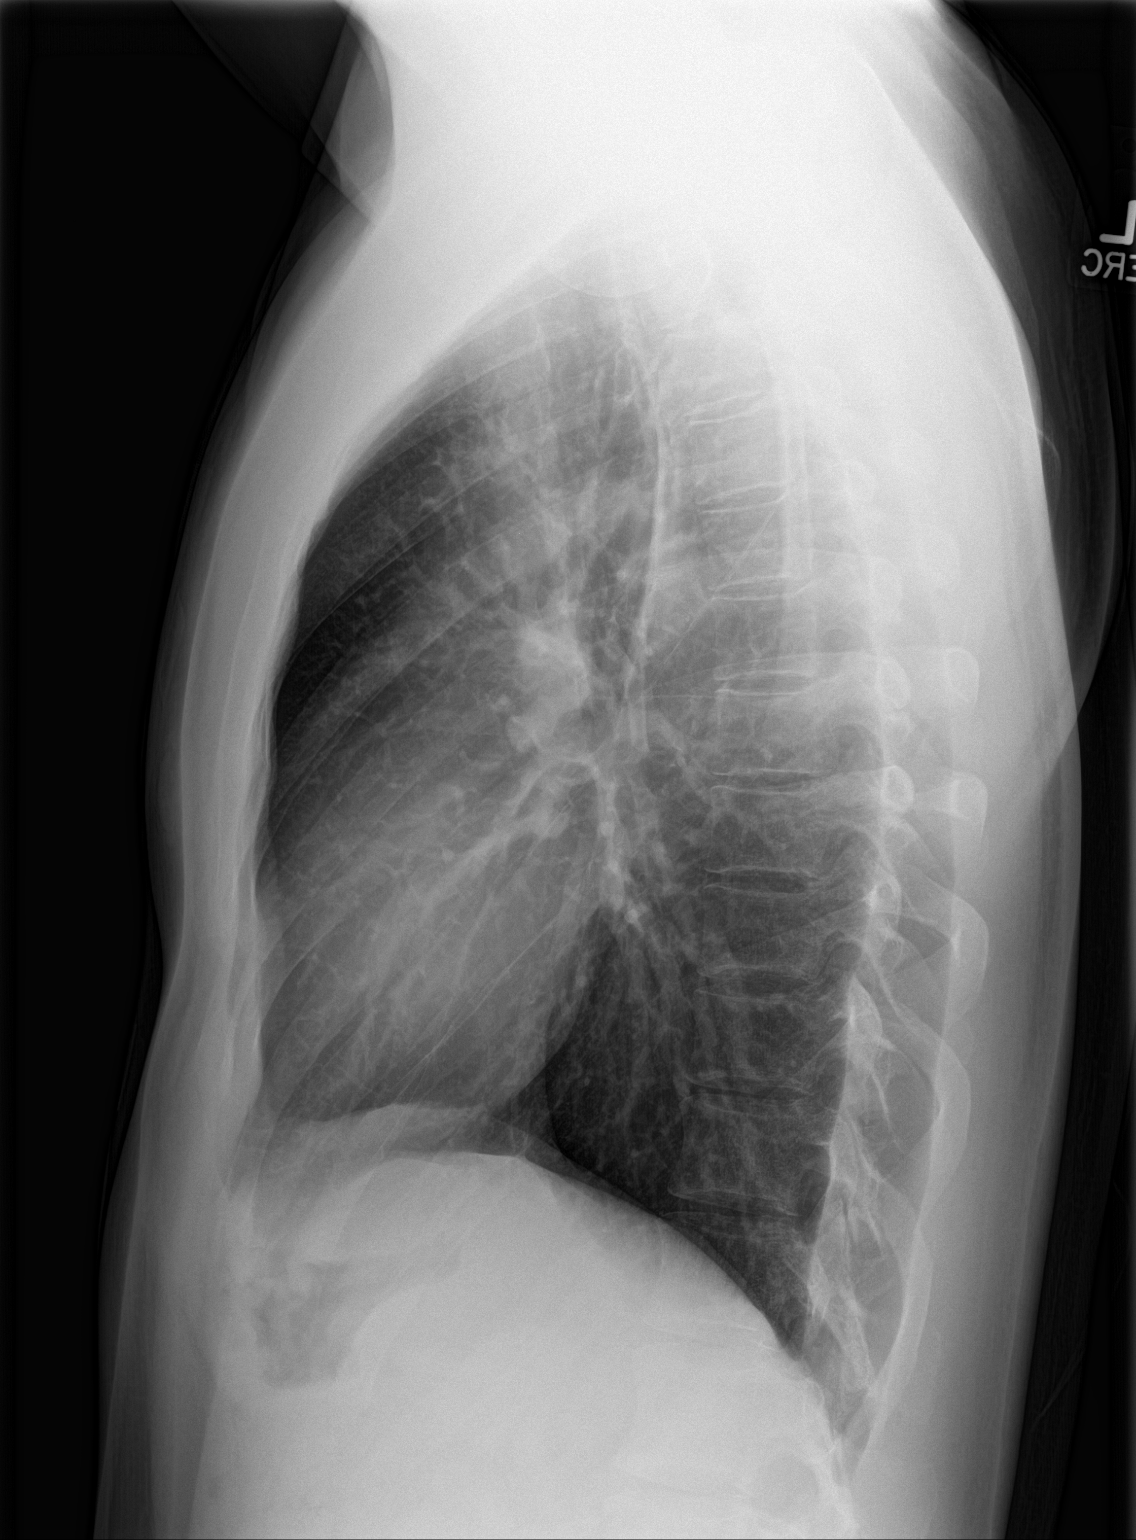

[2 of 2 positions shown; findings below may reference images not displayed]

FINDINGS: The cardiomediastinal contours are normal. The lungs are clear.
Pulmonary vasculature is normal. No consolidation, pleural effusion,
or pneumothorax. No acute osseous abnormalities are seen.
IMPRESSION: No acute pulmonary process.
